# Patient Record
Sex: Female | Born: 1955 | Hispanic: No | Marital: Married | Smoking: Never smoker
Health system: Southern US, Community
[De-identification: ages and names within clinical notes are randomized; demographics above are authoritative.]

---

## 2016-01-21 ENCOUNTER — Encounter: Payer: Self-pay | Admitting: Sports Medicine

## 2016-01-21 ENCOUNTER — Ambulatory Visit (INDEPENDENT_AMBULATORY_CARE_PROVIDER_SITE_OTHER): Payer: BLUE CROSS/BLUE SHIELD | Admitting: Sports Medicine

## 2016-01-21 VITALS — BP 126/74 | HR 70 | Ht 62.0 in | Wt 122.0 lb

## 2016-01-21 DIAGNOSIS — M25511 Pain in right shoulder: Secondary | ICD-10-CM | POA: Diagnosis not present

## 2016-01-21 MED ORDER — MELOXICAM 15 MG PO TABS: ORAL_TABLET | ORAL | Status: DC

## 2016-01-21 NOTE — Progress Notes (Signed)
   Subjective:    Patient ID: Destiny Reed, female    DOB: [birth year], 60 y.o.   MRN: [id number]  HPI chief complaint: Right shoulder pain  Very pleasant right-hand-dominant female comes in today complaining of 6 months of right shoulder pain. Pain began after her husband pulled on her right arm to help her get up from sitting on the floor. She had no immediate pain but developed diffuse shoulder pain a few days later. Her pain has slowly worsened. Although it is diffuse, she does state that it seems to be more in the posterior aspect of the shoulder than anywhere else. Pain is most noticeable with activity such as reaching out to pick up a heavy object. Also with reaching behind her back. She is getting pain with sleeping on her shoulder at night. Pain at times will radiate down into her forearm and hand. She denies any problems with this shoulder in the past. No prior shoulder surgeries. She is here today with her husband.  Past medical history reviewed Medications reviewed Allergies reviewed    Review of Systems As above    Objective:   Physical Exam  Well-developed, well-nourished. No acute distress. Awake alert and oriented 3. Vital signs reviewed  Right shoulder: Full range of motion. No tenderness to palpation over the [facility] nor over the clavicle. 4/5 strength with resisted supraspinatus on the right. This also reproduces pain. She also has reproducible pain with resisted infraspinatus and subscapularis but 5/5 strength. Pain with O'Briens testing. Tenderness to palpation in the bicipital groove. Neurovascularly intact distally.  MSK ultrasound of the right shoulder was performed. The supraspinatus tendon appears to have a high-grade partial thickness tear through the articular surface. There is also areas of calcification within the tendon consistent with chronic calcific tendinopathy. These changes are seen both in the long and short axis views. Subscapularis and infraspinatus  tendons appear unremarkable. Biceps tendon also appears unremarkable.      Assessment & Plan:   Right shoulder pain secondary to supraspinatus tear  I considered starting topical nitroglycerin but the patient has a history of migraines which preclude me from doing that. I will instead try a course of meloxicam 15 mg daily with food. She is cautioned about GI upset. She'll also start physical therapy under the guidance of Destiny Reed at [facility]. Patient will follow-up with me in 4 weeks for reevaluation and repeat ultrasound. If patient's symptoms persist or worsen, then we did discuss proceeding with an MRI and surgical consultation. She understands.

## 2016-01-28 ENCOUNTER — Other Ambulatory Visit: Payer: Self-pay | Admitting: *Deleted

## 2016-01-28 MED ORDER — DICLOFENAC SODIUM 75 MG PO TBEC: DELAYED_RELEASE_TABLET | ORAL | Status: AC

## 2016-02-23 ENCOUNTER — Other Ambulatory Visit: Payer: Self-pay | Admitting: *Deleted

## 2016-02-23 MED ORDER — TRAMADOL HCL 50 MG PO TABS
50.0000 mg | ORAL_TABLET | Freq: Two times a day (BID) | ORAL | Status: AC
Start: 2016-02-23 — End: ?

## 2016-03-19 ENCOUNTER — Encounter: Payer: Self-pay | Admitting: Sports Medicine

## 2016-03-19 ENCOUNTER — Ambulatory Visit (INDEPENDENT_AMBULATORY_CARE_PROVIDER_SITE_OTHER): Payer: BLUE CROSS/BLUE SHIELD | Admitting: Sports Medicine

## 2016-03-19 VITALS — BP 140/75 | Ht 62.0 in | Wt 122.0 lb

## 2016-03-19 DIAGNOSIS — M25511 Pain in right shoulder: Secondary | ICD-10-CM | POA: Diagnosis not present

## 2016-03-19 MED ORDER — METHYLPREDNISOLONE ACETATE 40 MG/ML IJ SUSP
40.0000 mg | Freq: Once | INTRAMUSCULAR | Status: AC
  Administered 2016-03-19: 40 mg via INTRA_ARTICULAR

## 2016-03-19 NOTE — Progress Notes (Signed)
   Subjective:    Patient ID: Destiny Reed, female    DOB: 02/08/1956, 60 y.o.   MRN: [id number]  HPI   Patient comes in today for follow-up on right shoulder pain. She was last seen in the office 2 months ago. An ultrasound done at that time suggested a high-grade partial-thickness tear of the supraspinatus tendon. She was placed on meloxicam but despite that her pain persists. It is diffuse throughout the shoulder. It is present with most shoulder activities such as reaching away from her body or behind her back. She has pain when sleeping at night. Symptoms have been present now for about 7 months. She is here today with her husband.    Review of Systems     Objective:   Physical Exam Well-developed, well-nourished. No acute distress  Right shoulder: Full range of motion with a positive painful arc. No tenderness to palpation over the acromioclavicular joint nor over the bicipital groove. Positive empty can, positive Hawkins. 4/5 strength with resisted supraspinatus. This also reproduces pain. 5/5 strength with resisted external rotation but this also reproduces pain. No pain with resisted internal rotation. Negative drop arm. Neurovascularly intact distally.       Assessment & Plan:   Persistent right shoulder pain worrisome for high-grade partial-thickness versus full-thickness rotator cuff tear  I discussed her treatment options with her and her husband today. She would like to try a cortisone injection and a home exercise program prior to proceeding with further imaging. Her right subacromial space was injected today with cortisone. She tolerated this without difficulty. I have given her a Jobe home exercise program. I explained to her that if symptoms do not start to improve within the next 2-3 weeks then we need to strongly consider further diagnostic imaging in the form of x-rays and MRI. She understands. Follow-up as needed.  Consent obtained and verified. Time-out  conducted. Noted no overlying erythema, induration, or other signs of local infection. Skin prepped in a sterile fashion. Topical analgesic spray: Ethyl chloride. Joint: right shoulder (subacromial) Needle: 25g 1.5 inch Completed without difficulty. Meds: 3cc 1% xylocaine, 1cc (40mg ) depomedrol  Advised to call if fevers/chills, erythema, induration, drainage, or persistent bleeding.

## 2018-08-11 ENCOUNTER — Other Ambulatory Visit: Payer: Self-pay | Admitting: Family Medicine

## 2018-08-11 DIAGNOSIS — Z1231 Encounter for screening mammogram for malignant neoplasm of breast: Secondary | ICD-10-CM

## 2018-09-19 ENCOUNTER — Ambulatory Visit
Admission: RE | Admit: 2018-09-19 | Discharge: 2018-09-19 | Disposition: A | Discharge status: Home / Self Care | Payer: BLUE CROSS/BLUE SHIELD | Source: Ambulatory Visit | Attending: Family Medicine | Admitting: Family Medicine

## 2018-09-19 DIAGNOSIS — Z1231 Encounter for screening mammogram for malignant neoplasm of breast: Secondary | ICD-10-CM

## 2020-05-11 IMAGING — MG DIGITAL SCREENING BILATERAL MAMMOGRAM WITH CAD
6 series · 6 of 6 positions shown · non-contrast
Comparison: Previous exam(s).

CLINICAL DATA: Screening.

EXAM:
DIGITAL SCREENING BILATERAL MAMMOGRAM WITH CAD

[R CC]
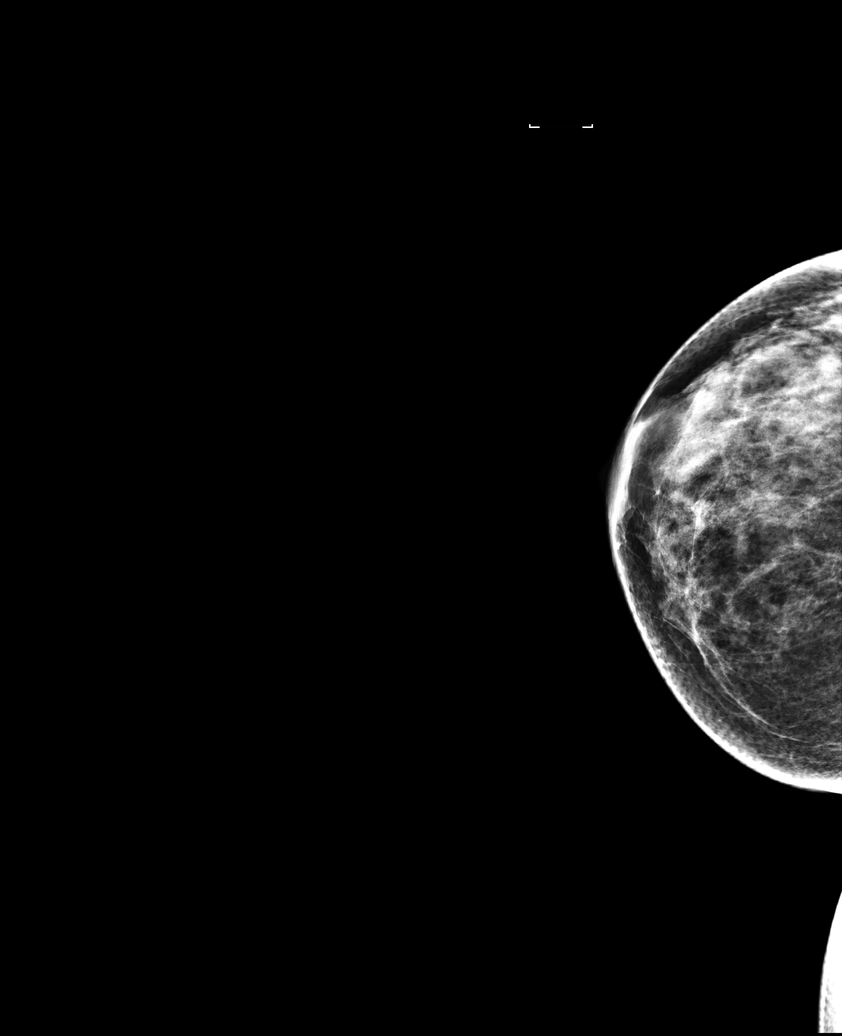

[R MLO (1 of 2)]
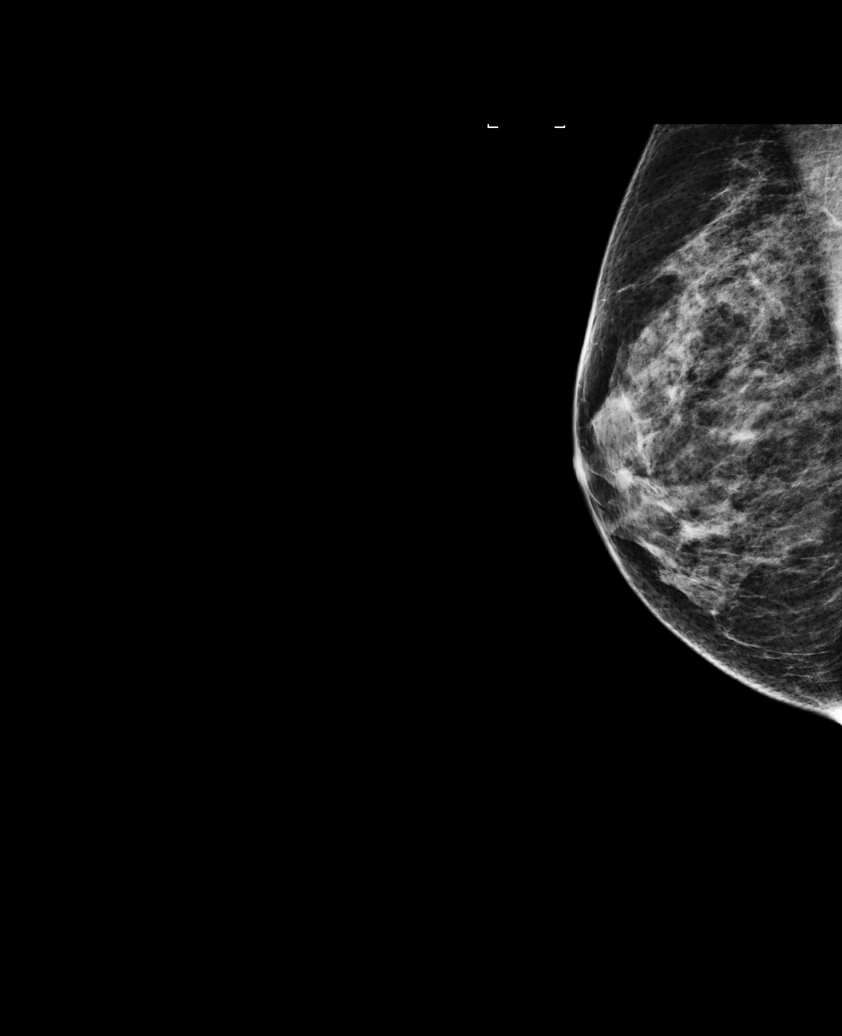

[L MLO (1 of 2)]
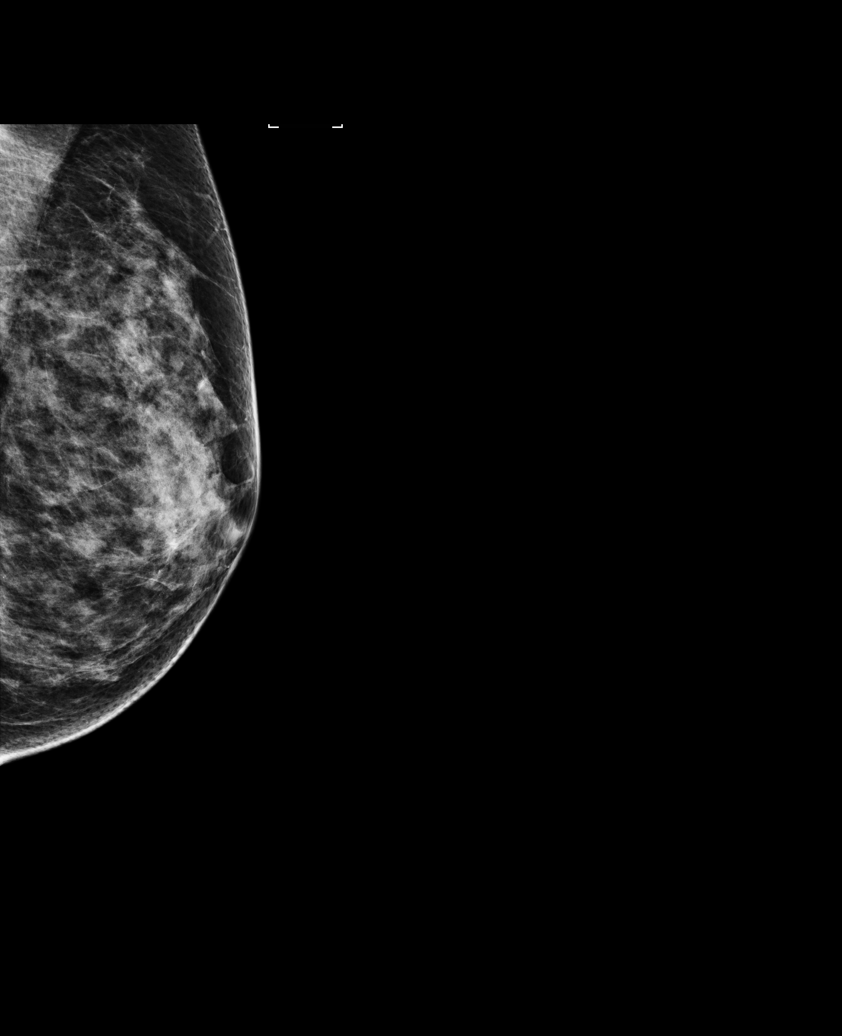

[L CC]
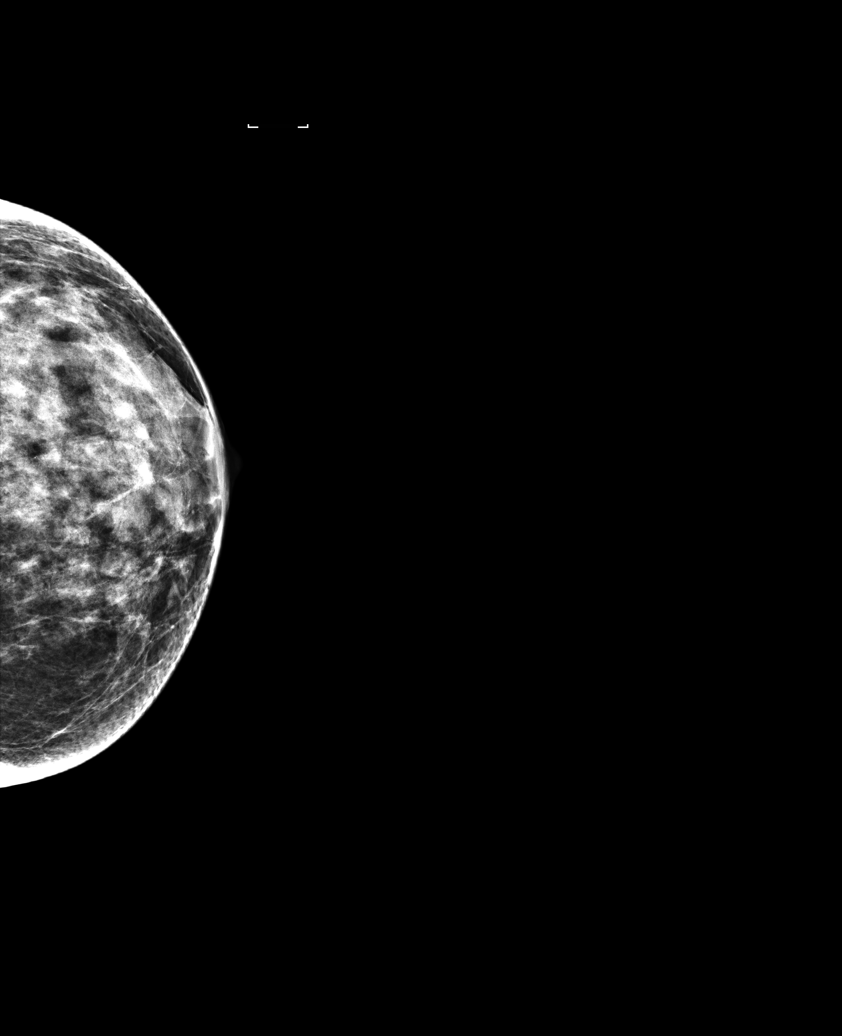

[L MLO (2 of 2)]
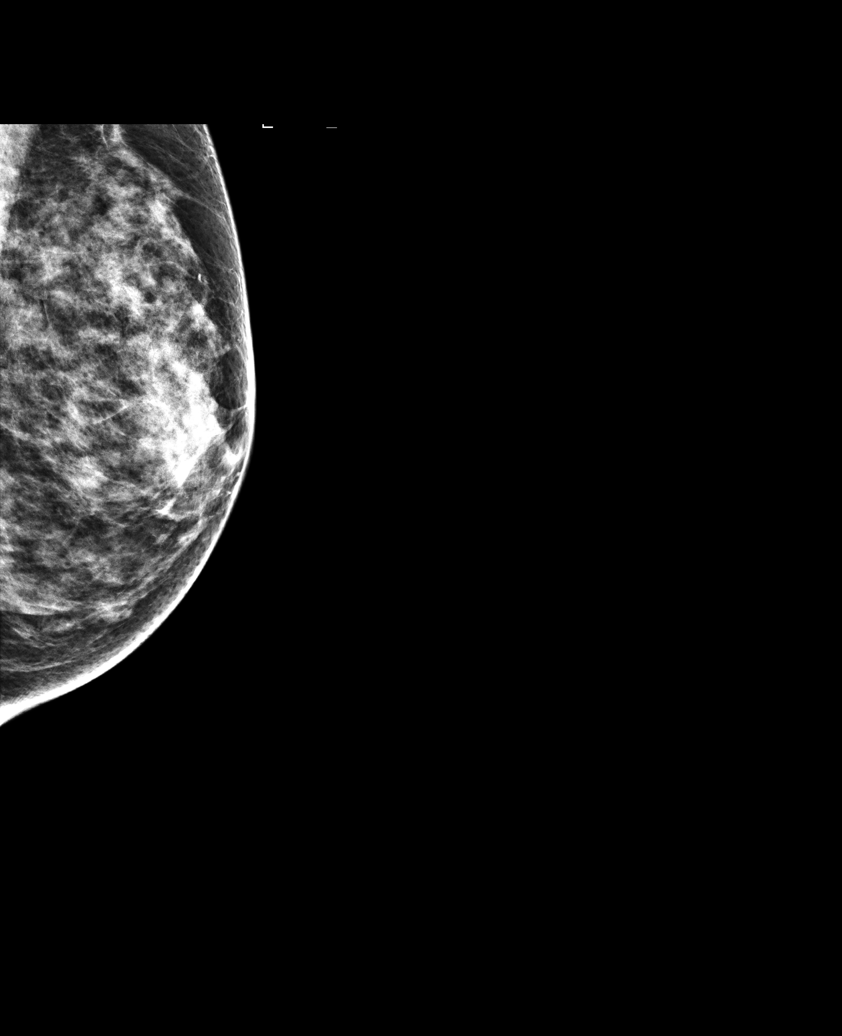

[R MLO (2 of 2)]
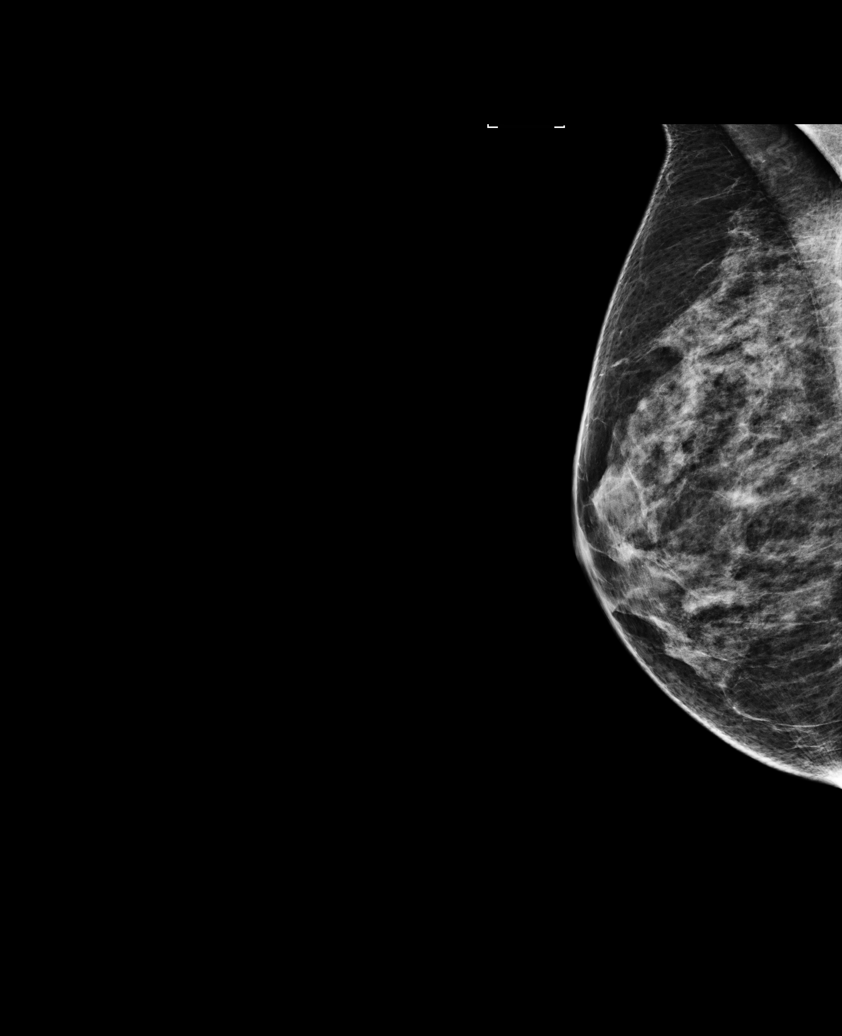

[6 of 6 positions shown; findings below may reference images not displayed]

ACR Breast Density Category c: The breast tissue is heterogeneously
dense, which may obscure small masses.
FINDINGS: There are no findings suspicious for malignancy. Images were
processed with CAD.
IMPRESSION: No mammographic evidence of malignancy. A result letter of this
screening mammogram will be mailed directly to the patient.

RECOMMENDATION:
Screening mammogram in one year. (Code:YJ-[id number])

BI-RADS CATEGORY  1: Negative.
